# Patient Record
Sex: Male | Born: 1990 | Race: Black or African American | Hispanic: No | Marital: Single | Smoking: Current every day smoker
Health system: Southern US, Community
[De-identification: ages and names within clinical notes are randomized; demographics above are authoritative.]

## PROBLEM LIST (undated history)

## (undated) DIAGNOSIS — F909 Attention-deficit hyperactivity disorder, unspecified type: Secondary | ICD-10-CM

## (undated) HISTORY — PX: ROTATOR CUFF REPAIR: SHX139

## (undated) HISTORY — DX: Attention-deficit hyperactivity disorder, unspecified type: F90.9

---
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
## 2010-02-20 ENCOUNTER — Ambulatory Visit: Payer: Self-pay | Admitting: Orthopedic Surgery

## 2010-05-07 ENCOUNTER — Emergency Department: Payer: Self-pay | Admitting: Emergency Medicine
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
## 2018-10-05 ENCOUNTER — Encounter: Payer: Self-pay | Admitting: *Deleted

## 2018-10-05 ENCOUNTER — Other Ambulatory Visit: Payer: Self-pay

## 2018-10-05 ENCOUNTER — Emergency Department
Admission: EM | Admit: 2018-10-05 | Discharge: 2018-10-05 | Discharge status: Against Medical Advice | Payer: Self-pay | Attending: Emergency Medicine | Admitting: Emergency Medicine

## 2018-10-05 DIAGNOSIS — F17210 Nicotine dependence, cigarettes, uncomplicated: Secondary | ICD-10-CM | POA: Insufficient documentation

## 2018-10-05 DIAGNOSIS — R569 Unspecified convulsions: Secondary | ICD-10-CM | POA: Insufficient documentation

## 2018-10-05 LAB — URINALYSIS, COMPLETE (UACMP) WITH MICROSCOPIC
Bilirubin Urine: NEGATIVE
GLUCOSE, UA: NEGATIVE mg/dL
KETONES UR: NEGATIVE mg/dL
Leukocytes, UA: NEGATIVE
Nitrite: NEGATIVE
PROTEIN: 30 mg/dL — AB
Specific Gravity, Urine: 1.025 (ref 1.005–1.030)
Squamous Epithelial / LPF: NONE SEEN (ref 0–5)
pH: 5.5 (ref 5.0–8.0)

## 2018-10-05 LAB — CBC
HEMATOCRIT: 45.2 % (ref 39.0–52.0)
HEMOGLOBIN: 15.2 g/dL (ref 13.0–17.0)
MCH: 31.9 pg (ref 26.0–34.0)
MCHC: 33.6 g/dL (ref 30.0–36.0)
MCV: 94.8 fL (ref 80.0–100.0)
Platelets: 238 10*3/uL (ref 150–400)
RBC: 4.77 MIL/uL (ref 4.22–5.81)
RDW: 13.7 % (ref 11.5–15.5)
WBC: 17.3 10*3/uL — ABNORMAL HIGH (ref 4.0–10.5)
nRBC: 0 % (ref 0.0–0.2)

## 2018-10-05 LAB — URINE DRUG SCREEN, QUALITATIVE (ARMC ONLY)
AMPHETAMINES, UR SCREEN: NOT DETECTED
BENZODIAZEPINE, UR SCRN: NOT DETECTED
Barbiturates, Ur Screen: NOT DETECTED
CANNABINOID 50 NG, UR ~~LOC~~: POSITIVE — AB
Cocaine Metabolite,Ur ~~LOC~~: POSITIVE — AB
MDMA (Ecstasy)Ur Screen: NOT DETECTED
Methadone Scn, Ur: NOT DETECTED
Opiate, Ur Screen: NOT DETECTED
PHENCYCLIDINE (PCP) UR S: NOT DETECTED
Tricyclic, Ur Screen: NOT DETECTED

## 2018-10-05 NOTE — Discharge Instructions (Addendum)
Return to the ER immediately for new, or recurrent seizures, severe headache, speech problems, vision changes, weakness or numbness, or if you change your mind and wish to do the work-up for a first-time seizure.

## 2018-10-05 NOTE — ED Provider Notes (Signed)
[facility] Emergency Department Provider Note ____________________________________________   First MD Initiated Contact with Patient 10/05/18 2000     (approximate)  I have reviewed the triage vital signs and the nursing notes.   HISTORY  Chief Complaint Seizures    HPI Vincent Howard is a 27 y.o. male with no significant PMH who presents after apparent seizure, acute onset, now resolved, and described as generalized.  The seizure was witnessed by a relative of the patient who is not present with him in the ED.  From what the patient was told, he had some shaking and was unconscious.  He began foaming at the mouth.  He was sleepy afterwards.  The patient has no prior history of seizures.  He feels sore in his body but denies any other acute symptoms.  He states that he drank 2 beers today and acknowledges that he may have used drugs although he did not tell me what.  History reviewed. No pertinent past medical history.  There are no active problems to display for this patient.   Past Surgical History:  Procedure Laterality Date  . ROTATOR CUFF REPAIR Right     Prior to Admission medications   Not on File    Allergies Aspirin  Family History  Problem Relation Age of Onset  . Seizures Mother     Social History Social History   Tobacco Use  . Smoking status: Current Every Day Smoker    Packs/day: 1.00    Years: 10.00    Pack years: 10.00    Types: Cigarettes  . Smokeless tobacco: Never Used  Substance Use Topics  . Alcohol use: Yes    Alcohol/week: 2.0 standard drinks    Types: 2 Cans of beer per week    Comment: 10/05/2018  . Drug use: Yes    Types: Marijuana    Comment: 10/05/2018    Review of Systems  Constitutional: No fever. Eyes: No visual changes. ENT: No sore throat. Cardiovascular: Denies chest pain. Respiratory: Denies shortness of breath. Gastrointestinal: No vomiting.  Genitourinary: Negative for flank  pain.  Musculoskeletal: Negative for back pain.  Positive for muscle soreness. Skin: Negative for rash. Neurological: Negative for headaches, focal weakness or numbness.   ____________________________________________   PHYSICAL EXAM:  VITAL SIGNS: ED Triage Vitals  Enc Vitals Group     BP 10/05/18 1947 123/62     Pulse Rate 10/05/18 1947 90     Resp 10/05/18 1947 (!) 25     Temp 10/05/18 1947 97.8 F (36.6 C)     Temp Source 10/05/18 1947 Oral     SpO2 10/05/18 1947 99 %     Weight 10/05/18 1948 190 lb (86.2 kg)     Height 10/05/18 1948 5\' 7"  (1.702 m)     Head Circumference --      Peak Flow --      Pain Score 10/05/18 1947 4     Pain Loc --      Pain Edu? --      Excl. in GC? --     Constitutional: Alert and oriented. Well appearing and in no acute distress. Eyes: Conjunctivae are normal.  Head: Atraumatic. Nose: No congestion/rhinnorhea. Mouth/Throat: Mucous membranes are moist.   Neck: Normal range of motion.  Cardiovascular: Normal rate, regular rhythm.   Good peripheral circulation. Respiratory: Normal respiratory effort.  No retractions.  Gastrointestinal: No distention.  Musculoskeletal: Extremities warm and well perfused.  Neurologic:  Normal speech and language.  Motor and sensory intact in all extremities.  Normal coordination with no ataxia on finger-to-nose.  Cranial nerves III through XII intact.  No pronator drift. Skin:  Skin is warm and dry. No rash noted. Psychiatric: Somewhat flat affect.  Speech and behavior are normal.  ____________________________________________   LABS (all labs ordered are listed, but only abnormal results are displayed)  Labs Reviewed  CBC - Abnormal; Notable for the following components:      Result Value   WBC 17.3 (*)    All other components within normal limits  BASIC METABOLIC PANEL  URINALYSIS, COMPLETE ([facility]) WITH MICROSCOPIC  URINE DRUG SCREEN, QUALITATIVE ([facility] ONLY)    ____________________________________________  EKG  Patient declined  ____________________________________________  RADIOLOGY  CT: Patient declined  ____________________________________________   PROCEDURES  Procedure(s) performed: No  Procedures  Critical Care performed: No ____________________________________________   INITIAL IMPRESSION / ASSESSMENT AND PLAN / ED COURSE  Pertinent labs & imaging results that were available during my care of the patient were reviewed by me and considered in my medical decision making (see chart for details).  27 year old male with no significant past medical history except for rotator cuff injury presents with an apparent first-time generalized seizure.  He is now asymptomatic although he reports some muscle tenderness.  On exam, the vital signs are normal and the patient is well-appearing.  Neuro exam is nonfocal.  The remainder of the exam is unremarkable.  Given that he was apparently confused afterwards and has muscle soreness, the presentation is consistent with a generalized tonic-clonic seizure.  At this time the patient declines CT and does not want to wait for results of lab work-up.  He is alert and oriented x4.  I explained to him that after a first-time seizure with negative work-up there is approximately 50% risk that he will have seizures in the future.  I explained that we typically do labs, EKG, and CT to rule out a precipitating cause of the seizure and that without doing this we cannot rule out a potentially life-threatening abnormality that could cause future seizures.  The patient was able to paraphrase this back to me and demonstrates appropriate understanding.  He demonstrates full decision-making capacity to decline further care and sign out AMA at this time.  Return precautions given, and the patient expresses understanding. ____________________________________________   FINAL CLINICAL IMPRESSION(S) / ED  DIAGNOSES  Final diagnoses:  Seizure (HCC)      NEW MEDICATIONS STARTED DURING THIS VISIT:  New Prescriptions   No medications on file     Note:  This document was prepared using Dragon voice recognition software and may include unintentional dictation errors.    Dionne Bucy, MD 10/05/18 2021

## 2018-10-05 NOTE — ED Triage Notes (Signed)
Per EMS family states he was getting into his car and had seizure like activity. He was shaking and foaming at the mouth. Pt has never had a seizure in the past. Family hx of seizures. Pt drank 2 beers today. Pt is presently alert and able to get up to the bsc. A&O x 4 feels tired and sore

## 2023-07-13 ENCOUNTER — Other Ambulatory Visit: Payer: Self-pay

## 2023-07-13 ENCOUNTER — Encounter: Payer: Self-pay | Admitting: Emergency Medicine

## 2023-07-13 ENCOUNTER — Emergency Department
Admission: EM | Admit: 2023-07-13 | Discharge: 2023-07-13 | Payer: Self-pay | Attending: Emergency Medicine | Admitting: Emergency Medicine

## 2023-07-13 DIAGNOSIS — F1990 Other psychoactive substance use, unspecified, uncomplicated: Secondary | ICD-10-CM | POA: Insufficient documentation

## 2023-07-13 DIAGNOSIS — Z0279 Encounter for issue of other medical certificate: Secondary | ICD-10-CM | POA: Insufficient documentation

## 2023-07-13 DIAGNOSIS — M7918 Myalgia, other site: Secondary | ICD-10-CM

## 2023-07-13 DIAGNOSIS — F1910 Other psychoactive substance abuse, uncomplicated: Secondary | ICD-10-CM

## 2023-07-13 DIAGNOSIS — M791 Myalgia, unspecified site: Secondary | ICD-10-CM | POA: Insufficient documentation

## 2023-07-13 DIAGNOSIS — D72829 Elevated white blood cell count, unspecified: Secondary | ICD-10-CM | POA: Insufficient documentation

## 2023-07-13 LAB — CBC WITH DIFFERENTIAL/PLATELET
Abs Immature Granulocytes: 0.03 10*3/uL (ref 0.00–0.07)
Basophils Absolute: 0.1 10*3/uL (ref 0.0–0.1)
Basophils Relative: 0 %
Eosinophils Absolute: 1 10*3/uL — ABNORMAL HIGH (ref 0.0–0.5)
Eosinophils Relative: 7 %
HCT: 39.2 % (ref 39.0–52.0)
Hemoglobin: 13.9 g/dL (ref 13.0–17.0)
Immature Granulocytes: 0 %
Lymphocytes Relative: 14 %
Lymphs Abs: 1.9 10*3/uL (ref 0.7–4.0)
MCH: 31.6 pg (ref 26.0–34.0)
MCHC: 35.5 g/dL (ref 30.0–36.0)
MCV: 89.1 fL (ref 80.0–100.0)
Monocytes Absolute: 0.7 10*3/uL (ref 0.1–1.0)
Monocytes Relative: 5 %
Neutro Abs: 10 10*3/uL — ABNORMAL HIGH (ref 1.7–7.7)
Neutrophils Relative %: 74 %
Platelets: 262 10*3/uL (ref 150–400)
RBC: 4.4 MIL/uL (ref 4.22–5.81)
RDW: 12.7 % (ref 11.5–15.5)
WBC: 13.7 10*3/uL — ABNORMAL HIGH (ref 4.0–10.5)
nRBC: 0 % (ref 0.0–0.2)

## 2023-07-13 LAB — BASIC METABOLIC PANEL
Anion gap: 7 (ref 5–15)
BUN: 13 mg/dL (ref 6–20)
CO2: 21 mmol/L — ABNORMAL LOW (ref 22–32)
Calcium: 9.1 mg/dL (ref 8.9–10.3)
Chloride: 112 mmol/L — ABNORMAL HIGH (ref 98–111)
Creatinine, Ser: 1.11 mg/dL (ref 0.61–1.24)
GFR, Estimated: >60 mL/min (ref >60)
Glucose, Bld: 135 mg/dL — ABNORMAL HIGH (ref 70–99)
Potassium: 3.6 mmol/L (ref 3.5–5.1)
Sodium: 140 mmol/L (ref 135–145)
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
